# Patient Record
Sex: Female | Born: 2000 | Race: Black or African American | Hispanic: No | Marital: Single | Smoking: Never smoker
Health system: Southern US, Community
[De-identification: ages and names within clinical notes are randomized; demographics above are authoritative.]

## PROBLEM LIST (undated history)

## (undated) DIAGNOSIS — J069 Acute upper respiratory infection, unspecified: Secondary | ICD-10-CM

## (undated) DIAGNOSIS — L309 Dermatitis, unspecified: Secondary | ICD-10-CM

## (undated) DIAGNOSIS — J45909 Unspecified asthma, uncomplicated: Secondary | ICD-10-CM

## (undated) HISTORY — DX: Acute upper respiratory infection, unspecified: J06.9

## (undated) HISTORY — PX: HERNIA REPAIR: SHX51

## (undated) HISTORY — DX: Unspecified asthma, uncomplicated: J45.909

## (undated) HISTORY — DX: Dermatitis, unspecified: L30.9

---
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
## 2024-09-10 NOTE — Progress Notes (Signed)
 "  New Patient Note  RE: Kylie Edwards MRN: [id number] DOB: November 10, 2000 Date of Office Visit: 09/11/2024  Consult requested by: Dr. Paul (ENT) Primary care provider: Margarete Maeola DASEN, FNP  Chief Complaint: Establish Care (Pt wants to have surgery and thinks she is allergic to environmental.)  History of Present Illness: I had the pleasure of seeing Kylie Edwards for initial evaluation at the [facility] on 09/11/2024. She is a 24 y.o. female, who is referred here by Dr. Paul (ENT) for the evaluation of allergies.  Discussed the use of AI scribe software for clinical note transcription with the patient, who gave verbal consent to proceed.  History of Present Illness   She has experienced persistent nasal symptoms, including rhinorrhea, nasal congestion, sneezing, and occasional vomiting triggered by nasal symptoms for the past four to five months. These symptoms have worsened over time and occur year-round. She also experiences headaches and anosmia.  She has a history of severe allergies since childhood, with previous allergy  testing indicating allergies to cats and dogs. She has not been on allergy  immunotherapy. She has tried various medications including prednisone, antibiotics, over-the-counter medications like Zyrtec and Claritin, and nasal sprays such as Flonase, with limited relief. She reports that her nose is so bad that nothing can go up there when she tries to use nasal sprays. She reports that a head scan and nasal endoscopy were performed by the ENT doctor. She has not had sinus surgery before.  Her asthma is exacerbated by exposure to pet dander, particularly cats. She uses an albuterol inhaler as needed, approximately weekly, when exposed to allergens. No recent fevers, chills, or changes in appetite.  She has eczema, which she manages with a cream prescribed by her primary doctor. She denies any known medication or food  allergies, but her mother smokes inside the house, which may contribute to her symptoms.  There are no pets at home. She does not smoke or vape.      She reports symptoms of rhinorrhea, nasal congestion, sneezing, PND. Symptoms have been going on for many years. The symptoms are present all year around. Anosmia: yes. Headache: yes. She has used zyrtec, Claritin, Flonase, Singulair  with minimal improvement in symptoms. Sinus infections: yes. Previous work up includes: skin testing as a child was positive to cats, dogs, dust mites per patient report. No prior AIT. Previous ENT evaluation: yes. Previous sinus imaging: yes. History of nasal polyps: yes. Last eye exam: in 2024.  History of reflux: denies.  08/12/2024 ENT visit: 1. Chronic sinusitis, unspecified location (Primary) CT images were independently reviewed today and showed very mild sinusitis which is likely residual from her most recent infection several weeks ago. We discussed the potential role of turbinate reduction if she still has refractory nasal obstruction after allergy  treatment has been maximized. Given her severe symptoms her prednisone was also represcribed given that she did not pick this up or was not called about this.  2. Sleep disorder breathing She has symptoms consistent with obstructive sleep apnea and a home sleep study scheduled for January 6.  3. Allergic rhinitis, unspecified seasonality, unspecified trigger She has significant turbinate hypertrophy and mucosal edema and previously positive allergy  testing many years ago. We discussed that this is likely the largest contributor to her chronic nasal obstruction and an allergy  referral was sent in today.   08/12/2024 CT sinus: 1. Moderate, polypoid mucosal thickening involving the bilateral maxillary sinuses and ethmoid air cells with bilateral OMC obstruction. 2.  Bilateral turbinate hypertrophy with mucosal thickening obstructing the left greater than right  nasal airway. 3. Well aerated bilateral frontal and sphenoid sinuses. 4. Small posterior left ethmoid osteoma.   Assessment and Plan: Kylie Edwards is a 24 y.o. female with: Other allergic rhinitis Chronic allergic rhinitis with persistent symptoms exacerbated by environmental allergens. Previous treatments provided limited relief. Imaging showed significant sinus involvement by ENT.  Return for allergy  skin testing (1-55). Will make additional recommendations based on results. Use Flonase (fluticasone) nasal spray 1-2 sprays per nostril once a day as needed for nasal congestion.  Use Atrovent  (ipratropium) 0.03% 1-2 sprays per nostril twice a day as needed for runny nose/drainage. Nasal saline spray (i.e., Simply Saline) or nasal saline lavage (i.e., NeilMed) is recommended as needed and prior to medicated nasal sprays. Start Xyzal  5mg  daily at night.  Stop 3 days before allergy  testing. Start Singulair  (montelukast ) 10mg  daily at night. Cautioned that in some children/adults can experience behavioral changes including hyperactivity, agitation, depression, sleep disturbances and suicidal ideations. These side effects are rare, but if you notice them you should notify me and discontinue Singulair  (montelukast ).  Mild intermittent asthma without complication Triggered by pet dander and uses albuterol prn with good benefit. Normal spirometry today. May use albuterol rescue inhaler 2 puffs every 4 to 6 hours as needed for shortness of breath, chest tightness, coughing, and wheezing.  Monitor frequency of use - if you need to use it more than twice per week on a consistent basis let us  know.   Other atopic dermatitis Keep track of rashes and take pictures. See below for proper skin care. Use fragrance free and dye free products. No dryer sheets or fabric softener.    Return for Skin testing.  Meds ordered this encounter  Medications   montelukast  (SINGULAIR ) 10 MG tablet    Sig: Take 1  tablet (10 mg total) by mouth at bedtime.    Dispense:  30 tablet    Refill:  5   levocetirizine (XYZAL ) 5 MG tablet    Sig: Take 1 tablet (5 mg total) by mouth every evening.    Dispense:  30 tablet    Refill:  5   ipratropium (ATROVENT ) 0.03 % nasal spray    Sig: Place 1-2 sprays into both nostrils 2 (two) times daily as needed (nasal drainage).    Dispense:  30 mL    Refill:  5   Lab Orders  No laboratory test(s) ordered today    Other allergy  screening: Asthma: yes Main triggers are pet danders.  Albuterol prn with good benefit.   Food allergy : no Medication allergy : no Hymenoptera allergy : no Urticaria: no Eczema: yes History of recurrent infections suggestive of immunodeficency: no  Diagnostics: Spirometry:  Tracings reviewed. Her effort: Good reproducible efforts. FVC: 2.84L FEV1: 2.35L, 74% predicted FEV1/FVC ratio: 83% Interpretation: Spirometry consistent with normal pattern.  Please see scanned spirometry results for details.  Results discussed with patient/family.   Past Medical History: There are no active problems to display for this patient.  Past Medical History:  Diagnosis Date   Asthma    Eczema    Recurrent upper respiratory infection (URI)    Past Surgical History: Past Surgical History:  Procedure Laterality Date   HERNIA REPAIR     Medication List:  Current Outpatient Medications  Medication Sig Dispense Refill   albuterol (VENTOLIN HFA) 108 (90 Base) MCG/ACT inhaler Inhale 2 puffs into the lungs every 4 (four) hours as needed.     clotrimazole  (LOTRIMIN )  1 % cream Apply to affected area 2 times daily 30 g 0   cyclobenzaprine  (FLEXERIL ) 5 MG tablet Take 1 tablet (5 mg total) by mouth 3 (three) times daily as needed for muscle spasms. 10 tablet 0   fluticasone (FLONASE) 50 MCG/ACT nasal spray Place 1 spray into both nostrils daily.     ibuprofen (ADVIL) 200 MG tablet Take 200 mg by mouth every 6 (six) hours as needed.     ipratropium  (ATROVENT ) 0.03 % nasal spray Place 1-2 sprays into both nostrils 2 (two) times daily as needed (nasal drainage). 30 mL 5   levocetirizine (XYZAL ) 5 MG tablet Take 1 tablet (5 mg total) by mouth every evening. 30 tablet 5   medroxyPROGESTERone (DEPO-PROVERA) 150 MG/ML injection Inject 150 mg into the muscle every 3 (three) months.     montelukast  (SINGULAIR ) 10 MG tablet Take 1 tablet (10 mg total) by mouth at bedtime. 30 tablet 5   No current facility-administered medications for this visit.   Allergies: Allergies[1] Social History: Social History   Socioeconomic History   Marital status: Single    Spouse name: Not on file   Number of children: Not on file   Years of education: Not on file   Highest education level: Not on file  Occupational History   Not on file  Tobacco Use   Smoking status: Never   Smokeless tobacco: Never  Substance and Sexual Activity   Alcohol use: Never   Drug use: Never   Sexual activity: Not on file  Other Topics Concern   Not on file  Social History Narrative   Not on file   Social Drivers of Health   Tobacco Use: Low Risk (09/11/2024)   Patient History    Smoking Tobacco Use: Never    Smokeless Tobacco Use: Never    Passive Exposure: Not on file  Financial Resource Strain: Not on file  Food Insecurity: Not on File (05/31/2023)   Received from [employer]    Food: 0  Transportation Needs: Not on file  Physical Activity: Not on file  Stress: Not on file  Social Connections: Not on File (05/18/2023)   Received from [employer]   Social Connections    Connectedness: 0  Depression (PHQ2-9): Not on file  Alcohol Screen: Not on file  Housing: Not on file  Utilities: Not on file  Health Literacy: Not on file   Lives in an apartment. Smoking: denies Mom smokes indoors Occupation: Equities Trader HistorySurveyor, Minerals in the house: no Engineer, Civil (consulting) in the family room: no Carpet in the bedroom: yes Heating: electric Cooling:  central Pet: no  Family History: Family History  Problem Relation Age of Onset   Allergic rhinitis Mother    Asthma Mother    Asthma Father    Allergic rhinitis Father    Review of Systems  Constitutional:  Negative for appetite change, chills, fever and unexpected weight change.  HENT:  Positive for congestion, postnasal drip, rhinorrhea and sneezing.   Eyes:  Negative for itching.  Respiratory:  Negative for cough, chest tightness, shortness of breath and wheezing.   Cardiovascular:  Negative for chest pain.  Gastrointestinal:  Negative for abdominal pain.  Genitourinary:  Negative for difficulty urinating.  Skin:  Negative for rash.  Neurological:  Positive for headaches.    Objective: BP 110/80 (BP Location: Left Arm, Patient Position: Sitting, Cuff Size: Large)   Pulse 84   Temp 98.1 F (36.7 C) (Temporal)  Resp 18   Ht 5' 7.64 (1.718 m)   Wt (!) 309 lb 12.8 oz (140.5 kg)   SpO2 97%   BMI 47.61 kg/m  Body mass index is 47.61 kg/m. Physical Exam Vitals and nursing note reviewed.  Constitutional:      Appearance: Normal appearance. She is well-developed.  HENT:     Head: Normocephalic and atraumatic.     Right Ear: Tympanic membrane and external ear normal.     Left Ear: Tympanic membrane and external ear normal.     Nose: Congestion and rhinorrhea present.     Comments: Transverse nasal crease    Mouth/Throat:     Mouth: Mucous membranes are moist.     Pharynx: Oropharynx is clear.  Eyes:     Conjunctiva/sclera: Conjunctivae normal.  Cardiovascular:     Rate and Rhythm: Normal rate and regular rhythm.     Heart sounds: Normal heart sounds. No murmur heard.    No friction rub. No gallop.  Pulmonary:     Effort: Pulmonary effort is normal.     Breath sounds: Normal breath sounds. No wheezing, rhonchi or rales.  Musculoskeletal:     Cervical back: Neck supple.  Skin:    General: Skin is warm.     Findings: No rash.  Neurological:     Mental Status:  She is alert and oriented to person, place, and time.  Psychiatric:        Behavior: Behavior normal.    The plan was reviewed with the patient/family, and all questions/concerned were addressed.  It was my pleasure to see Kylie Edwards today and participate in her care. Please feel free to contact me with any questions or concerns.  Sincerely,  Orlan Cramp, DO Allergy  & Immunology  [facility]  [place] office: [phone] [facility] office: [phone]    [1] No Known Allergies  "

## 2024-09-11 ENCOUNTER — Other Ambulatory Visit: Payer: Self-pay

## 2024-09-11 ENCOUNTER — Encounter: Payer: Self-pay | Admitting: Allergy

## 2024-09-11 ENCOUNTER — Ambulatory Visit: Payer: Self-pay | Admitting: Allergy

## 2024-09-11 VITALS — BP 110/80 | HR 84 | Temp 98.1°F | Resp 18 | Ht 67.64 in | Wt 309.8 lb

## 2024-09-11 DIAGNOSIS — L2089 Other atopic dermatitis: Secondary | ICD-10-CM | POA: Diagnosis not present

## 2024-09-11 DIAGNOSIS — J3089 Other allergic rhinitis: Secondary | ICD-10-CM

## 2024-09-11 DIAGNOSIS — J4520 Mild intermittent asthma, uncomplicated: Secondary | ICD-10-CM

## 2024-09-11 MED ORDER — LEVOCETIRIZINE DIHYDROCHLORIDE 5 MG PO TABS: 5.0000 mg | ORAL_TABLET | Freq: Every evening | ORAL | 5 refills | Status: AC

## 2024-09-11 MED ORDER — IPRATROPIUM BROMIDE 0.03 % NA SOLN: 1.0000 | Freq: Two times a day (BID) | NASAL | 5 refills | Status: AC | PRN

## 2024-09-11 MED ORDER — MONTELUKAST SODIUM 10 MG PO TABS: 10.0000 mg | ORAL_TABLET | Freq: Every day | ORAL | 5 refills | Status: AC

## 2024-09-11 NOTE — Patient Instructions (Addendum)
 Rhinitis  Return for allergy  skin testing. Will make additional recommendations based on results. Make sure you don't take any antihistamines for 3 days before the skin testing appointment. Don't put any lotion on the back and arms on the day of testing.  Must be in good health and not ill. No vaccines/injections/antibiotics within the past 7 days.  Plan on being here for 30-60 minutes.  Use Flonase (fluticasone) nasal spray 1-2 sprays per nostril once a day as needed for nasal congestion.  Use Atrovent  (ipratropium) 0.03% 1-2 sprays per nostril twice a day as needed for runny nose/drainage. Nasal saline spray (i.e., Simply Saline) or nasal saline lavage (i.e., NeilMed) is recommended as needed and prior to medicated nasal sprays. Start Xyzal  5mg  daily at night.  Stop 3 days before allergy  testing.  Start Singulair  (montelukast ) 10mg  daily at night. Cautioned that in some children/adults can experience behavioral changes including hyperactivity, agitation, depression, sleep disturbances and suicidal ideations. These side effects are rare, but if you notice them you should notify me and discontinue Singulair  (montelukast ).  Asthma Normal spirometry today. May use albuterol rescue inhaler 2 puffs every 4 to 6 hours as needed for shortness of breath, chest tightness, coughing, and wheezing.  Monitor frequency of use - if you need to use it more than twice per week on a consistent basis let us  know.   Skin  Keep track of rashes and take pictures. See below for proper skin care. Use fragrance free and dye free products. No dryer sheets or fabric softener.    Follow up for skin testing.

## 2024-09-15 NOTE — Progress Notes (Unsigned)
 "  Skin testing note  RE: Kylie Edwards MRN: [id number] DOB: August 09, 2001 Date of Office Visit: 09/16/2024  Referring provider: Margarete Maeola DASEN, FNP Primary care provider: Margarete Maeola DASEN, FNP  Chief Complaint: skin testing  History of Present Illness: I had the pleasure of seeing Kylie Edwards for a skin testing visit at the [facility] on 09/16/2024. She is a 24 y.o. female, who is being followed for allergic rhinitis, asthma, atopic dermatitis. Her previous allergy  office visit was on 09/11/2024 with Dr. Luke. Today is a skin testing visit.   Discussed the use of AI scribe software for clinical note transcription with the patient, who gave verbal consent to proceed.    She has multiple allergies, including grass pollen, ragweed pollen, dust mites, cats, and dogs, leading to both seasonal and perennial symptoms. Current medications include Flonase and ipratropium nasal sprays, as well as Xyzal  and Singulair  tablets. Despite these treatments, she continues to experience significant nasal symptoms, such as persistent nasal drainage.   She was previously evaluated by an ENT specialist. However, she continues to suffer from severe nasal congestion and drainage, describing her nose as 'still gushing out' and feeling 'inflamed.'  She also experiences gastrointestinal symptoms, specifically nausea and vomiting after eating. She often feels the need to vomit even when smelling food.      Assessment and Plan: Kylie Edwards is a 24 y.o. female with: Other allergic rhinitis Seasonal allergic rhinitis due to pollen Allergic rhinitis due to dust mite Allergic rhinitis due to animal dander Past history - Chronic allergic rhinitis with persistent symptoms exacerbated by environmental allergens. Previous treatments provided limited relief. Imaging showed significant sinus involvement by ENT.  09/16/2024 skin testing positive to grass, ragweed, dust mites, cat and dog.  Follow  up with ENT regarding sinus surgery. Start environmental control measures as below. Use Flonase (fluticasone) nasal spray 1-2 sprays per nostril once a day as needed for nasal congestion.  Use Atrovent  (ipratropium) 0.03% 1-2 sprays per nostril twice a day as needed for runny nose/drainage. Nasal saline spray (i.e., Simply Saline) or nasal saline lavage (i.e., NeilMed) is recommended as needed and prior to medicated nasal sprays. Start Xyzal  5mg  daily at night.  Start Singulair  (montelukast ) 10mg  daily at night. Cautioned that in some children/adults can experience behavioral changes including hyperactivity, agitation, depression, sleep disturbances and suicidal ideations. These side effects are rare, but if you notice them you should notify me and discontinue Singulair  (montelukast ). Start allergy  injections. 2 injections.  Had a detailed discussion with patient/family that clinical history is suggestive of allergic rhinitis, and may benefit from allergy  immunotherapy (AIT). Discussed in detail regarding the dosing, schedule, side effects (mild to moderate local allergic reaction and rarely systemic allergic reactions including anaphylaxis), and benefits (significant improvement in nasal symptoms, seasonal flares of asthma) of immunotherapy with the patient. There is significant time commitment involved with allergy  shots, which includes weekly immunotherapy injections for first 9-12 months and then biweekly to monthly injections for 3-5 years. Consent was signed. I have prescribed epinephrine  device and demonstrated proper use. For mild symptoms you can take over the counter antihistamines (zyrtec 10mg  to 20mg ) and monitor symptoms closely.  If symptoms worsen or if you have severe symptoms including breathing issues, throat closure, significant swelling, whole body hives, severe diarrhea and vomiting, lightheadedness then use epinephrine  and seek immediate medical care afterwards. Emergency action  plan given.   Mild intermittent asthma without complication Past history - Triggered by pet dander and uses  albuterol prn with good benefit. 2025 spirometry normal.  May use albuterol rescue inhaler 2 puffs every 4 to 6 hours as needed for shortness of breath, chest tightness, coughing, and wheezing.  Monitor frequency of use - if you need to use it more than twice per week on a consistent basis let us  know.    Other atopic dermatitis Keep track of rashes and take pictures. Continue proper skin care.   Vomiting Recommend seeing GI next.   Return in about 6 months (around 03/16/2025).  Meds ordered this encounter  Medications   EPINEPHrine  0.3 mg/0.3 mL IJ SOAJ injection    Sig: Inject 0.3 mg into the muscle as needed for anaphylaxis.    Dispense:  2 each    Refill:  1    May dispense generic/Mylan/Teva brand.   Lab Orders  No laboratory test(s) ordered today    Diagnostics: Skin Testing: Environmental allergy  panel. 09/16/2024 skin testing positive to grass, ragweed, dust mites, cat and dog.  Results discussed with patient/family.  Airborne Adult Perc - 09/16/24 1046     Time Antigen Placed 1040    Allergen Manufacturer Jestine    Location Back    Number of Test 55    1. Control-Buffer 50% Glycerol Negative    2. Control-Histamine 2+    3. Bahia Negative    4. Bermuda 2+    5. Johnson Negative    6. [place]  Blue 3+    7. Meadow Fescue --   +/-   8. Perennial Rye Negative    9. Timothy 2+    10. Ragweed Mix Negative    11. Cocklebur Negative    12. Plantain,  English Negative    13. Baccharis Negative    14. Dog Fennel Negative    15. Russian Thistle Negative    16. Lamb's Quarters Negative    17. Sheep Sorrell Negative    18. Rough Pigweed Negative    19. Marsh Elder, Rough Negative    20. Mugwort, Common Negative    21. Box, Elder Negative    22. Cedar, red Negative    23. Sweet Gum Negative    24. Pecan Pollen Negative    25. Pine Mix Negative    26.  Walnut, Black Pollen Negative    27. Red Mulberry Negative    28. Ash Mix Negative    29. Birch Mix Negative    30. Beech American Negative    31. Cottonwood, Eastern Negative    32. Hickory, White Negative    33. Maple Mix Negative    34. Oak, Eastern Mix Negative    35. Sycamore Eastern Negative    36. Alternaria Alternata Negative    37. Cladosporium Herbarum Negative    38. Aspergillus Mix Negative    40. Bipolaris Sorokiniana (Helminthosporium) Negative    41. Drechslera Spicifera (Curvularia) Negative    42. Mucor Plumbeus Negative    43. Fusarium Moniliforme Negative    44. Aureobasidium Pullulans (pullulara) Negative    46. Botrytis Cinera Negative    47. Epicoccum Nigrum Negative    48. Phoma Betae Negative    49. Dust Mite Mix 2+    50. Cat Hair 10,000 BAU/ml 2+    51.  Dog Epithelia Negative    52. Mixed Feathers Negative    53. Horse Epithelia Negative    54. Cockroach, German Negative    55. Tobacco Leaf Negative          Intradermal - 09/16/24 1124  Time Antigen Placed 1120    Location Arm    Number of Test 12    Control Negative    Johnson 4+    Ragweed Mix Negative    Weed Mix 2+    Tree Mix Negative    Mold 1 Negative    Mold 2 Negative    Mold 3 Negative    Mold 4 Negative    Dog 2+    Cockroach Negative          Previous notes and tests were reviewed. The plan was reviewed with the patient/family, and all questions/concerned were addressed.  It was my pleasure to see Kylie Edwards today and participate in her care. Please feel free to contact me with any questions or concerns.  Sincerely,  Orlan Cramp, DO Allergy  & Immunology  [facility]  [facility] office: [phone] [facility] office: [phone] "

## 2024-09-16 ENCOUNTER — Encounter: Payer: Self-pay | Admitting: Allergy

## 2024-09-16 ENCOUNTER — Ambulatory Visit: Admitting: Allergy

## 2024-09-16 DIAGNOSIS — J301 Allergic rhinitis due to pollen: Secondary | ICD-10-CM

## 2024-09-16 DIAGNOSIS — L2089 Other atopic dermatitis: Secondary | ICD-10-CM

## 2024-09-16 DIAGNOSIS — J3081 Allergic rhinitis due to animal (cat) (dog) hair and dander: Secondary | ICD-10-CM

## 2024-09-16 DIAGNOSIS — R1110 Vomiting, unspecified: Secondary | ICD-10-CM

## 2024-09-16 DIAGNOSIS — J3089 Other allergic rhinitis: Secondary | ICD-10-CM | POA: Diagnosis not present

## 2024-09-16 DIAGNOSIS — J4520 Mild intermittent asthma, uncomplicated: Secondary | ICD-10-CM

## 2024-09-16 MED ORDER — EPINEPHRINE 0.3 MG/0.3ML IJ SOAJ: 0.3000 mg | INTRAMUSCULAR | 1 refills | Status: AC | PRN

## 2024-09-16 NOTE — Patient Instructions (Addendum)
 09/16/2024 skin testing positive to grass, ragweed, dust mites, cat and dog.   Results given.  Environmental allergies Follow up with ENT regarding sinus surgery. Start environmental control measures as below. Use Flonase (fluticasone) nasal spray 1-2 sprays per nostril once a day as needed for nasal congestion.  Use Atrovent  (ipratropium) 0.03% 1-2 sprays per nostril twice a day as needed for runny nose/drainage. Nasal saline spray (i.e., Simply Saline) or nasal saline lavage (i.e., NeilMed) is recommended as needed and prior to medicated nasal sprays. Start Xyzal  5mg  daily at night.  Start Singulair  (montelukast ) 10mg  daily at night. Cautioned that in some children/adults can experience behavioral changes including hyperactivity, agitation, depression, sleep disturbances and suicidal ideations. These side effects are rare, but if you notice them you should notify me and discontinue Singulair  (montelukast ).  Start allergy  injections. 2 injections.  Had a detailed discussion with patient/family that clinical history is suggestive of allergic rhinitis, and may benefit from allergy  immunotherapy (AIT). Discussed in detail regarding the dosing, schedule, side effects (mild to moderate local allergic reaction and rarely systemic allergic reactions including anaphylaxis), and benefits (significant improvement in nasal symptoms, seasonal flares of asthma) of immunotherapy with the patient. There is significant time commitment involved with allergy  shots, which includes weekly immunotherapy injections for first 9-12 months and then biweekly to monthly injections for 3-5 years. Consent was signed. I have prescribed epinephrine  device and demonstrated proper use. For mild symptoms you can take over the counter antihistamines (zyrtec 10mg  to 20mg ) and monitor symptoms closely.  If symptoms worsen or if you have severe symptoms including breathing issues, throat closure, significant swelling, whole body hives,  severe diarrhea and vomiting, lightheadedness then use epinephrine  and seek immediate medical care afterwards. Emergency action plan given.  Asthma May use albuterol rescue inhaler 2 puffs every 4 to 6 hours as needed for shortness of breath, chest tightness, coughing, and wheezing.  Monitor frequency of use - if you need to use it more than twice per week on a consistent basis let us  know.   Skin  Keep track of rashes and take pictures. Continue proper skin care.   Vomiting Recommend seeing GI next.   Return in about 6 months (around 03/16/2025). Or sooner if needed.  Schedule for first shot appointment in [facility] office.  Reducing Pollen Exposure Pollen seasons: trees (spring), grass (summer) and ragweed/weeds (fall). Keep windows closed in your home and car to lower pollen exposure.  Install air conditioning in the bedroom and throughout the house if possible.  Avoid going out in dry windy days - especially early morning. Pollen counts are highest between 5 - 10 AM and on dry, hot and windy days.  Save outside activities for late afternoon or after a heavy rain, when pollen levels are lower.  Avoid mowing of grass if you have grass pollen allergy . Be aware that pollen can also be transported indoors on people and pets.  Dry your clothes in an automatic dryer rather than hanging them outside where they might collect pollen.  Rinse hair and eyes before bedtime.  Control of House Dust Mite Allergen Dust mite allergens are a common trigger of allergy  and asthma symptoms. While they can be found throughout the house, these microscopic creatures thrive in warm, humid environments such as bedding, upholstered furniture and carpeting. Because so much time is spent in the bedroom, it is essential to reduce mite levels there.  Encase pillows, mattresses, and box springs in special allergen-proof fabric covers or airtight, zippered  plastic covers.  Bedding should be washed weekly in hot  water (130 F) and dried in a hot dryer. Allergen-proof covers are available for comforters and pillows that cant be regularly washed.  Wash the allergy -proof covers every few months. Minimize clutter in the bedroom. Keep pets out of the bedroom.  Keep humidity less than 50% by using a dehumidifier or air conditioning. You can buy a humidity measuring device called a hygrometer to monitor this.  If possible, replace carpets with hardwood, linoleum, or washable area rugs. If that's not possible, vacuum frequently with a vacuum that has a HEPA filter. Remove all upholstered furniture and non-washable window drapes from the bedroom. Remove all non-washable stuffed toys from the bedroom.  Wash stuffed toys weekly. Pet Allergen Avoidance: Contrary to popular opinion, there are no hypoallergenic breeds of dogs or cats. That is because people are not allergic to an animals hair, but to an allergen found in the animal's saliva, dander (dead skin flakes) or urine. Pet allergy  symptoms typically occur within minutes. For some people, symptoms can build up and become most severe 8 to 12 hours after contact with the animal. People with severe allergies can experience reactions in public places if dander has been transported on the pet owners clothing. Keeping an animal outdoors is only a partial solution, since homes with pets in the yard still have higher concentrations of animal allergens. Before getting a pet, ask your allergist to determine if you are allergic to animals. If your pet is already considered part of your family, try to minimize contact and keep the pet out of the bedroom and other rooms where you spend a great deal of time. As with dust mites, vacuum carpets often or replace carpet with a hardwood floor, tile or linoleum. High-efficiency particulate air (HEPA) cleaners can reduce allergen levels over time. While dander and saliva are the source of cat and dog allergens, urine is the source of  allergens from rabbits, hamsters, mice and guinea pigs; so ask a non-allergic family member to clean the animals cage. If you have a pet allergy , talk to your allergist about the potential for allergy  immunotherapy (allergy  shots). This strategy can often provide long-term relief.

## 2024-09-17 ENCOUNTER — Telehealth: Payer: Self-pay | Admitting: Allergy

## 2024-09-17 ENCOUNTER — Telehealth: Payer: Self-pay

## 2024-09-17 DIAGNOSIS — J302 Other seasonal allergic rhinitis: Secondary | ICD-10-CM | POA: Diagnosis not present

## 2024-09-17 MED ORDER — TRIAMCINOLONE ACETONIDE 0.1 % EX CREA: 1.0000 | TOPICAL_CREAM | Freq: Two times a day (BID) | CUTANEOUS | 0 refills | Status: AC

## 2024-09-17 NOTE — Progress Notes (Signed)
 VIALS MADE ON 09/17/24

## 2024-09-17 NOTE — Telephone Encounter (Signed)
 Spoke with Dr. Lorin. She advised that patient use antihistamine 2 times daily (okay to use up to four times daily). We will also send in Triamcinolone  to her pharmacy to apply to the affected area.

## 2024-09-17 NOTE — Telephone Encounter (Signed)
 Patient called and reassured her that it is not abnormal and does not mean a delayed positive. Per Dr. Lorin advised that she can take 2 antihistamines per day ( up to 4 per day). We can also send in a topical steroid cream for her which patient wants. Confirmed pharmacy and sent in refill.

## 2024-09-17 NOTE — Telephone Encounter (Signed)
 Pt called and stated she has welts from where she had her allergy  test done yesterday and they are getting worse. She would like a call ASAP from someone on the clinical staff. Best contact [phone]

## 2024-09-17 NOTE — Progress Notes (Signed)
 Aeroallergen Immunotherapy   Ordering Provider: Orlan Cramp, DO   Patient Details  Name: Kylie Edwards  MRN: [id number]  Date of Birth: September 21, 2000   Order 2 of 2   Vial Label: Dm-C-D   0.5 ml (Volume)   AU Concentration -- Mite Mix (DF 5,000 & DP 5,000)  0.5 ml (Volume)  1:10 Concentration -- Cat Hair  0.5 ml (Volume)  1:10 Concentration -- Dog Epithelia    1.5  ml Extract Subtotal  3.5  ml Diluent  5.0  ml Maintenance Total   Schedule:  B  Green Vial (1:1,000): Schedule B (6 doses)  Blue Vial (1:100): Schedule B (6 doses)  Yellow Vial (1:10): Schedule B (6 doses)  Red Vial (1:1): Schedule A (14 doses)   Special Instructions: May come in 1-2 times a week during build up as tolerated. Once a week on red vial.  Once on red vial #1 0.5cc go every 2 weeks, on red vial #2 0.5cc go every 4 weeks. May build up red vials faster (0.1, 0.3, 0.5).

## 2024-09-17 NOTE — Progress Notes (Signed)
 Aeroallergen Immunotherapy  Ordering Provider: Orlan Cramp, DO  Patient Details Name: Kylie Edwards MRN: [id number] Date of Birth: 02-10-2001  Order 1 of 2  Vial Label: G-RW  0.3 ml (Volume)  BAU Concentration -- 7 Grass Mix* 100,000 ([place]  Blue, Brimson, Orchard, Perennial Rye, RedTop, Sweet Vernal, Timothy) 0.2 ml (Volume)  1:20 Concentration -- Bahia 0.3 ml (Volume)  BAU Concentration -- Bermuda 10,000 0.3 ml (Volume)  1:20 Concentration -- Ragweed Mix   1.1  ml Extract Subtotal 3.9  ml Diluent 5.0  ml Maintenance Total  Schedule:  B Green Vial (1:1,000): Schedule B (6 doses) Blue Vial (1:100): Schedule B (6 doses) Yellow Vial (1:10): Schedule B (6 doses) Red Vial (1:1): Schedule A (14 doses)  Special Instructions: May come in 1-2 times a week during build up as tolerated. Once a week on red vial. Once on red vial #1 0.5cc go every 2 weeks, on red vial #2 0.5cc go every 4 weeks. May build up red vials faster (0.1, 0.3, 0.5).

## 2024-09-18 DIAGNOSIS — J3081 Allergic rhinitis due to animal (cat) (dog) hair and dander: Secondary | ICD-10-CM | POA: Diagnosis not present

## 2024-09-18 DIAGNOSIS — J3089 Other allergic rhinitis: Secondary | ICD-10-CM | POA: Diagnosis not present

## 2024-10-07 ENCOUNTER — Ambulatory Visit

## 2024-10-07 DIAGNOSIS — J302 Other seasonal allergic rhinitis: Secondary | ICD-10-CM

## 2025-03-24 ENCOUNTER — Ambulatory Visit: Admitting: Allergy
